# Patient Record
Sex: Female | Born: 1980 | Race: Black or African American | Hispanic: No | Marital: Single | Smoking: Never smoker
Health system: Southern US, Community
[De-identification: ages and names within clinical notes are randomized; demographics above are authoritative.]

---

## 2013-06-30 ENCOUNTER — Encounter (HOSPITAL_COMMUNITY): Payer: Self-pay | Admitting: Emergency Medicine

## 2013-06-30 ENCOUNTER — Emergency Department (HOSPITAL_COMMUNITY)
Admission: EM | Admit: 2013-06-30 | Discharge: 2013-06-30 | Disposition: A | Discharge status: Home / Self Care | Payer: Federal, State, Local not specified - PPO | Source: Home / Self Care | Attending: Emergency Medicine | Admitting: Emergency Medicine

## 2013-06-30 DIAGNOSIS — L509 Urticaria, unspecified: Secondary | ICD-10-CM

## 2013-06-30 LAB — POCT RAPID STREP A: STREPTOCOCCUS, GROUP A SCREEN (DIRECT): NEGATIVE

## 2013-06-30 MED ORDER — PREDNISONE 20 MG PO TABS
40.0000 mg | ORAL_TABLET | Freq: Once | ORAL | Status: AC
  Administered 2013-06-30: 40 mg via ORAL

## 2013-06-30 MED ORDER — FAMOTIDINE 40 MG PO TABS: 40.0000 mg | ORAL_TABLET | Freq: Once | ORAL | Status: DC

## 2013-06-30 MED ORDER — RANITIDINE HCL 75 MG PO TABS: 75.0000 mg | ORAL_TABLET | Freq: Two times a day (BID) | ORAL | Status: AC

## 2013-06-30 MED ORDER — PREDNISONE 20 MG PO TABS
ORAL_TABLET | ORAL | Status: AC
  Filled 2013-06-30: qty 2

## 2013-06-30 MED ORDER — DIPHENHYDRAMINE HCL 25 MG PO CAPS
ORAL_CAPSULE | ORAL | Status: AC
  Filled 2013-06-30: qty 2

## 2013-06-30 MED ORDER — DIPHENHYDRAMINE HCL 25 MG PO CAPS
50.0000 mg | ORAL_CAPSULE | Freq: Once | ORAL | Status: AC
  Administered 2013-06-30: 50 mg via ORAL

## 2013-06-30 MED ORDER — HYDROXYZINE PAMOATE 25 MG PO CAPS: 25.0000 mg | ORAL_CAPSULE | Freq: Three times a day (TID) | ORAL | Status: AC | PRN

## 2013-06-30 MED ORDER — PREDNISONE 10 MG PO TABS: ORAL_TABLET | ORAL | Status: AC

## 2013-06-30 NOTE — ED Notes (Signed)
Pt c/o a rash all over body including face, arms, chest, legs onset 1 week Reports rash got worse last night; sister has same sxs Denies f/v/n/d, new meds/foods/hygiene products, difficulty breathing/swallowing Alert w/no signs of acute distress.

## 2013-06-30 NOTE — Discharge Instructions (Signed)
Your test for strep throat was negative.  °

## 2013-06-30 NOTE — ED Provider Notes (Signed)
Medical screening examination/treatment/procedure(s) were performed by non-physician practitioner and as supervising physician I was immediately available for consultation/collaboration.  Leslee Homeavid Laberta Wilbon, M.D.  Reuben Likesavid C Somara Frymire, MD 06/30/13 [phone]

## 2013-06-30 NOTE — ED Provider Notes (Addendum)
CSN: [id number]     Arrival date & time 06/30/13  1110 History   First MD Initiated Contact with Patient 06/30/13 1134     Chief Complaint  Patient presents with  . Rash     (Consider location/radiation/quality/duration/timing/severity/associated sxs/prior Treatment) HPI Comments: Patient presents with rash that she woke with two days ago. Rash is diffusely distributed and consists of very pruritic maculopapular lesions over entire body. Denies any recent illness or new exposures. Only recent travel has been to [place], [place] with her family. No fever/chills. Of interest, her sister, who shares house with patient, woke with similar rash. Other family members that traveled with her to [place] do not have rash. Denies N/V/D, URI sx, or swelling of tongue, lips or throat. No previous similar events.  The history is provided by the patient.    History reviewed. No pertinent past medical history. History reviewed. No pertinent past surgical history. No family history on file. History  Substance Use Topics  . Smoking status: Never Smoker   . Smokeless tobacco: Not on file  . Alcohol Use: Yes   OB History   Grav Para Term Preterm Abortions TAB SAB Ect Mult Living                 Review of Systems  All other systems reviewed and are negative.      Allergies  Review of patient's allergies indicates no known allergies.  Home Medications   Current Outpatient Rx  Name  Route  Sig  Dispense  Refill  . Pramipexole Dihydrochloride (MIRAPEX PO)   Oral   Take by mouth.         . hydrOXYzine (VISTARIL) 25 MG capsule   Oral   Take 1 capsule (25 mg total) by mouth 3 (three) times daily as needed for itching.   30 capsule   0   . norethindrone-ethinyl estradiol-iron (MICROGESTIN FE,GILDESS FE,LOESTRIN FE) 1.5-30 MG-MCG tablet   Oral   Take 1 tablet by mouth daily.         . predniSONE (DELTASONE) 10 MG tablet      Beginning 07/01/2013, take 3 tabs po QD day 1, 2 tabs po QD  day 2, 1 tab po QD days 3 & 4 then stop   7 tablet   0   . ranitidine (ZANTAC) 75 MG tablet   Oral   Take 1 tablet (75 mg total) by mouth 2 (two) times daily. X 7 days   14 tablet   0    BP 143/77  Pulse 92  Temp(Src) 97.8 F (36.6 C) (Oral)  Resp 18  SpO2 100% Physical Exam  Nursing note and vitals reviewed. Constitutional: She is oriented to person, place, and time. She appears well-developed and well-nourished. No distress.  HENT:  Head: Normocephalic and atraumatic.  Right Ear: External ear normal.  Left Ear: External ear normal.  Nose: Nose normal.  Mouth/Throat: Oropharynx is clear and moist. No oropharyngeal exudate.  Eyes: Conjunctivae are normal. Right eye exhibits no discharge. Left eye exhibits no discharge. No scleral icterus.  Neck: Normal range of motion. Neck supple. No thyromegaly present.  Cardiovascular: Normal rate, regular rhythm and normal heart sounds.   Pulmonary/Chest: Effort normal and breath sounds normal. No stridor. No respiratory distress. She has no wheezes.  Abdominal: Soft. Bowel sounds are normal. She exhibits no distension. There is no tenderness.  Musculoskeletal: Normal range of motion. She exhibits no edema and no tenderness.  Lymphadenopathy:    She has no  cervical adenopathy.  Neurological: She is alert and oriented to person, place, and time.  Skin: Skin is warm and dry. Rash noted.  Widespread urticaria. No mucous membrane lesions.   Psychiatric: She has a normal mood and affect. Her behavior is normal.    ED Course  Procedures (including critical care time) Labs Review Labs Reviewed  CULTURE, GROUP A STREP  POCT RAPID STREP A ([facility] CARE ONLY)   Imaging Review No results found.    MDM   Final diagnoses:  Urticaria  Rapid strep negative. Exam consistent with urticaria of unknown origin. Despite sister having similar rash, condition appears to be an allergic response rather than infectious. Will begin treatment with H2  blocker, anti-histamine and oral steroids and advise follow up if no improvement.   Jess BartersJennifer Lee River Heights[place]on, [place] 06/30/13 [id number]  Geza Beranek Lee MurphyPresson, [place] 07/01/13 [phone]

## 2013-07-01 NOTE — ED Provider Notes (Signed)
Medical screening examination/treatment/procedure(s) were performed by non-physician practitioner and as supervising physician I was immediately available for consultation/collaboration.  Leslee Homeavid Chenoah Mcnally, M.D.  Reuben Likesavid C Ceirra Belli, MD 07/01/13 [phone]

## 2013-07-02 LAB — CULTURE, GROUP A STREP

## 2013-07-02 NOTE — ED Notes (Signed)
Patient called  w questions about her visit, medications prescribed , course of problem. States that if she is no better tomorrow, she will be rechecked

## 2016-01-26 ENCOUNTER — Other Ambulatory Visit: Payer: Self-pay | Admitting: Family Medicine

## 2016-01-26 ENCOUNTER — Ambulatory Visit
Admission: RE | Admit: 2016-01-26 | Discharge: 2016-01-26 | Disposition: A | Discharge status: Home / Self Care | Payer: Federal, State, Local not specified - PPO | Source: Ambulatory Visit | Attending: Family Medicine | Admitting: Family Medicine

## 2016-01-26 DIAGNOSIS — M5489 Other dorsalgia: Secondary | ICD-10-CM

## 2017-10-25 ENCOUNTER — Other Ambulatory Visit: Payer: Self-pay | Admitting: Obstetrics and Gynecology

## 2017-10-25 DIAGNOSIS — N6320 Unspecified lump in the left breast, unspecified quadrant: Secondary | ICD-10-CM

## 2018-03-17 ENCOUNTER — Ambulatory Visit: Payer: Federal, State, Local not specified - PPO

## 2018-03-17 ENCOUNTER — Ambulatory Visit: Payer: Federal, State, Local not specified - PPO | Admitting: Podiatry

## 2018-03-17 DIAGNOSIS — S92902S Unspecified fracture of left foot, sequela: Secondary | ICD-10-CM | POA: Diagnosis not present

## 2018-03-17 DIAGNOSIS — S92902A Unspecified fracture of left foot, initial encounter for closed fracture: Secondary | ICD-10-CM

## 2018-03-17 DIAGNOSIS — S86312A Strain of muscle(s) and tendon(s) of peroneal muscle group at lower leg level, left leg, initial encounter: Secondary | ICD-10-CM

## 2018-03-17 DIAGNOSIS — S92902K Unspecified fracture of left foot, subsequent encounter for fracture with nonunion: Secondary | ICD-10-CM | POA: Diagnosis not present

## 2018-03-18 ENCOUNTER — Telehealth: Payer: Self-pay | Admitting: *Deleted

## 2018-03-18 DIAGNOSIS — S92902S Unspecified fracture of left foot, sequela: Secondary | ICD-10-CM

## 2018-03-18 DIAGNOSIS — S92902K Unspecified fracture of left foot, subsequent encounter for fracture with nonunion: Secondary | ICD-10-CM

## 2018-03-18 DIAGNOSIS — S86312A Strain of muscle(s) and tendon(s) of peroneal muscle group at lower leg level, left leg, initial encounter: Secondary | ICD-10-CM

## 2018-03-18 NOTE — Telephone Encounter (Signed)
Orders to J. Quintana, RN for pre-cert, faxed to [facility]. 

## 2018-03-18 NOTE — Telephone Encounter (Signed)
-----   Message from Vivi Barrack, DPM sent at 03/18/2018  9:34 AM EST ----- Can you please order an MRI of the left ankle to rule out a peroneal tendon tear and have them include as much of the foot as possible. She has a 5th metatarsal base fracture that I would like evaluated as well. Can you see if they can get all of that on one study so we don't have to order 2 tests? Thanks.

## 2018-03-18 NOTE — Progress Notes (Signed)
Subjective:   Patient ID: Tara Trujillo, female   DOB: 37 y.o.   MRN: [id number]   HPI 37 year old female presents the office today for concerns of continued pain to her left lateral foot.  She states that she fell and she broke her fifth metatarsal base on December 07, 2017 at that point she was seen at urgent care and had x-rays performed.  She followed up with orthopedics as well and she was also in a surgical shoe.  She went for follow-up couple of appointments however she did recently go to urgent care again on 04/11/2018 to work continued pain and she had new x-rays performed which does still reveal the fracture.  She is continue to get pain in the aspect foot as well as going up her ankle.  Document regular shoes but is affecting her activity level.  She also has undergone physical therapy without any significant improvement.  She presents today for second opinion.   Review of Systems  All other systems reviewed and are negative.  No past medical history on file.  No past surgical history on file.   Current Outpatient Medications:  .  hydrOXYzine (VISTARIL) 25 MG capsule, Take 1 capsule (25 mg total) by mouth 3 (three) times daily as needed for itching., Disp: 30 capsule, Rfl: 0 .  norethindrone-ethinyl estradiol-iron (MICROGESTIN FE,GILDESS FE,LOESTRIN FE) 1.5-30 MG-MCG tablet, Take 1 tablet by mouth daily., Disp: , Rfl:  .  Pramipexole Dihydrochloride (MIRAPEX PO), Take by mouth., Disp: , Rfl:  .  predniSONE (DELTASONE) 10 MG tablet, Beginning 07/01/2013, take 3 tabs po QD day 1, 2 tabs po QD day 2, 1 tab po QD days 3 & 4 then stop, Disp: 7 tablet, Rfl: 0 .  ranitidine (ZANTAC) 75 MG tablet, Take 1 tablet (75 mg total) by mouth 2 (two) times daily. X 7 days, Disp: 14 tablet, Rfl: 0  No Known Allergies        Objective:  Physical Exam   General: AAO x3, NAD  Dermatological: Skin is warm, dry and supple bilateral. Nails x 10 are well manicured; remaining integument appears  unremarkable at this time. There are no open sores, no preulcerative lesions, no rash or signs of infection present.  Vascular: Dorsalis Pedis artery and Posterior Tibial artery pedal pulses are 2/4 bilateral with immedate capillary fill time. Pedal hair growth present. No varicosities and no lower extremity edema present bilateral. There is no pain with calf compression, swelling, warmth, erythema.   Neruologic: Grossly intact via light touch bilateral.Protective threshold with Semmes Wienstein monofilament intact to all pedal sites bilateral.  Negative Tinel sign.  Musculoskeletal: There is tenderness palpation of the left foot on the tarsal base.  There is also tenderness on the course the peroneal tendon just posterior and inferior to the lateral malleolus on the insertion of the fifth metatarsal base.  Overall the tendon appears to be intact.  Achilles tendon intact.  Mild discomfort of the fourth metatarsal base as well.  No other areas of pinpoint tenderness.  Mild swelling to the lateral aspect the foot on the fifth metatarsal base but there is no erythema or warmth.  No other areas of tenderness.  Muscular strength 5/5 in all groups tested bilateral.  Gait: Unassisted, Nonantalgic.       Assessment:   Fifth metatarsal base nonunion     Plan:  -Treatment options discussed including all alternatives, risks, and complications -Etiology of symptoms were discussed -I independently reviewed the x-rays that she brought in.  Note the initial x-rays which revealed an avulsion fracture of the fifth metatarsal base.  Performed on December 07, 2017.  On repeat x-rays performed on March 15, 2018 with a radiolucent line still evident at fifth metatarsal base consistent with a nonunion with approximate fracture gap of 2 mm.  Due to this and will order a bone stimulator given the nonunion.  Also I dispensed a cam boot and I want her to wear.  I am also can order an MRI to evaluate the nonunion as well as  the integrity of the peroneal tendon throughout peroneal tendon tear given continued pain despite treatment over the last 3 months.  Vivi Barrack DPM

## 2018-03-25 ENCOUNTER — Telehealth: Payer: Self-pay | Admitting: Podiatry

## 2018-03-25 NOTE — Telephone Encounter (Signed)
I was suppose to be contacted by someone for a bone stimulator and I have not heard anything.

## 2018-03-25 NOTE — Telephone Encounter (Signed)
Left message for T. Roseanne RenoStewart - [employer] to call with status of bone growth stimulator.

## 2018-03-25 NOTE — Telephone Encounter (Signed)
I had spoken to [place] about this. Can you check with him to see the status? Thanks.

## 2018-03-26 NOTE — Telephone Encounter (Signed)
I spoke with T. Roseanne RenoStewart - [employer], and he states he is to contact pt today with coverage information.

## 2018-03-27 ENCOUNTER — Ambulatory Visit (HOSPITAL_COMMUNITY)
Admission: RE | Admit: 2018-03-27 | Discharge: 2018-03-27 | Disposition: A | Discharge status: Home / Self Care | Payer: Federal, State, Local not specified - PPO | Source: Ambulatory Visit | Attending: Podiatry | Admitting: Podiatry

## 2018-03-27 DIAGNOSIS — S92902S Unspecified fracture of left foot, sequela: Secondary | ICD-10-CM | POA: Diagnosis present

## 2018-03-27 DIAGNOSIS — S86312A Strain of muscle(s) and tendon(s) of peroneal muscle group at lower leg level, left leg, initial encounter: Secondary | ICD-10-CM | POA: Diagnosis not present

## 2018-03-27 DIAGNOSIS — S92352A Displaced fracture of fifth metatarsal bone, left foot, initial encounter for closed fracture: Secondary | ICD-10-CM | POA: Insufficient documentation

## 2018-04-01 ENCOUNTER — Telehealth: Payer: Self-pay | Admitting: *Deleted

## 2018-04-01 NOTE — Telephone Encounter (Signed)
Pt called for MRI results.

## 2018-04-01 NOTE — Telephone Encounter (Signed)
I informed pt of Dr. Gabriel RungWagoner's review of the MRi results. Pt states she has not received a call from the [employer] bone growth stimulator representative. I gave pt Thurston Poundsrey Stewart[employer] - [employer] mobile number.

## 2018-04-01 NOTE — Telephone Encounter (Signed)
Please let her know that the MRI did show a chronic fracture of the 5th metatarsal base. Did she get her bone stimulator?

## 2018-04-16 ENCOUNTER — Ambulatory Visit (INDEPENDENT_AMBULATORY_CARE_PROVIDER_SITE_OTHER): Payer: Federal, State, Local not specified - PPO

## 2018-04-16 ENCOUNTER — Ambulatory Visit (INDEPENDENT_AMBULATORY_CARE_PROVIDER_SITE_OTHER): Payer: Federal, State, Local not specified - PPO | Admitting: Podiatry

## 2018-04-16 DIAGNOSIS — S92902S Unspecified fracture of left foot, sequela: Secondary | ICD-10-CM

## 2018-04-21 ENCOUNTER — Ambulatory Visit: Payer: Federal, State, Local not specified - PPO | Admitting: Podiatry

## 2018-04-21 NOTE — Progress Notes (Signed)
Subjective: 37 year old female presents the office for evaluation of a subacute fracture to the fifth metatarsal base of the left foot.  Since I last saw her she did order a bone stimulator online and she is been wearing the cam boot overall her foot feels much better.  She denies any increase in swelling or redness or any recent injury or trauma since I last saw her. Denies any systemic complaints such as fevers, chills, nausea, vomiting. No acute changes since last appointment, and no other complaints at this time.   Objective: AAO x3, NAD DP/PT pulses palpable bilaterally, CRT less than 3 seconds Overall no significant improvement in the tenderness in the left foot.  Only very minimal tenderness palpation of the fifth metatarsal base is present.  The pain over the course of the peroneal tendon over the tendon appears to be intact.  There is no erythema or warmth.  No open lesions or pre-ulcerative lesions.  No pain with calf compression, swelling, warmth, erythema  Assessment: Healing fracture left fifth metatarsal base  Plan: -All treatment options discussed with the patient including all alternatives, risks, complications.  -X-rays obtained reviewed.  Increased consolidation across the fracture site.  No evidence of acute fracture. -At this point I want her to remain in the cam boot.  Continue the bone stimulator daily.  Continue ice elevation and limit activity. -Patient encouraged to call the office with any questions, concerns, change in symptoms.   Return in about 4 weeks (around 05/14/2018). x-ray next appointment   Vivi BarrackMatthew R Lolly Glaus DPM

## 2018-05-15 ENCOUNTER — Ambulatory Visit (INDEPENDENT_AMBULATORY_CARE_PROVIDER_SITE_OTHER): Payer: Federal, State, Local not specified - PPO

## 2018-05-15 ENCOUNTER — Ambulatory Visit: Payer: Federal, State, Local not specified - PPO | Admitting: Podiatry

## 2018-05-15 ENCOUNTER — Encounter: Payer: Self-pay | Admitting: Podiatry

## 2018-05-15 DIAGNOSIS — M779 Enthesopathy, unspecified: Secondary | ICD-10-CM

## 2018-05-15 DIAGNOSIS — S92902S Unspecified fracture of left foot, sequela: Secondary | ICD-10-CM | POA: Diagnosis not present

## 2018-05-15 NOTE — Patient Instructions (Signed)
Ankle Sprain, Phase I Rehab Ask your health care provider which exercises are safe for you. Do exercises exactly as told by your health care provider and adjust them as directed. It is normal to feel mild stretching, pulling, tightness, or discomfort as you do these exercises, but you should stop right away if you feel sudden pain or your pain gets worse.Do not begin these exercises until told by your health care provider. Stretching and range of motion exercises These exercises warm up your muscles and joints and improve the movement and flexibility of your lower leg and ankle. These exercises also help to relieve pain and stiffness. Exercise A: Gastroc and soleus stretch  1. Sit on the floor with your left / right leg extended. 2. Loop a belt or towel around the ball of your left / right foot. The ball of your foot is on the walking surface, right under your toes. 3. Keep your left / right ankle and foot relaxed and keep your knee straight while you use the belt or towel to pull your foot toward you. You should feel a gentle stretch behind your calf or knee. 4. Hold this position for __________ seconds, then release to the starting position. Repeat the exercise with your knee bent. You can put a pillow or a rolled bath towel under your knee to support it. You should feel a stretch deep in your calf or at your Achilles tendon. Repeat each stretch __________ times. Complete these stretches __________ times a day. Exercise B: Ankle alphabet  1. Sit with your left / right leg supported at the lower leg. ? Do not rest your foot on anything. ? Make sure your foot has room to move freely. 2. Think of your left / right foot as a paintbrush, and move your foot to trace each letter of the alphabet in the air. Keep your hip and knee still while you trace. Make the letters as large as you can without feeling discomfort. 3. Trace every letter from A to Z. Repeat __________ times. Complete this exercise  __________ times a day. Strengthening exercises These exercises build strength and endurance in your ankle and lower leg. Endurance is the ability to use your muscles for a long time, even after they get tired. Exercise C: Dorsiflexors  1. Secure a rubber exercise band or tube to an object, such as a table leg, that will stay still when the band is pulled. Secure the other end around your left / right foot. 2. Sit on the floor facing the object, with your left / right leg extended. The band or tube should be slightly tense when your foot is relaxed. 3. Slowly bring your foot toward you, pulling the band tighter. 4. Hold this position for __________ seconds. 5. Slowly return your foot to the starting position. Repeat __________ times. Complete this exercise __________ times a day. Exercise D: Plantar flexors  1. Sit on the floor with your left / right leg extended. 2. Loop a rubber exercise tube or band around the ball of your left / right foot. The ball of your foot is on the walking surface, right under your toes. ? Hold the ends of the band or tube in your hands. ? The band or tube should be slightly tense when your foot is relaxed. 3. Slowly point your foot and toes downward, pushing them away from you. 4. Hold this position for __________ seconds. 5. Slowly return your foot to the starting position. Repeat __________ times. Complete   this exercise __________ times a day. Exercise E: Evertors 1. Sit on the floor with your legs straight out in front of you. 2. Loop a rubber exercise band or tube around the ball of your left / right foot. The ball of your foot is on the walking surface, right under your toes. ? Hold the ends of the band in your hands, or secure the band to a stable object. ? The band or tube should be slightly tense when your foot is relaxed. 3. Slowly push your foot outward, away from your other leg. 4. Hold this position for __________ seconds. 5. Slowly return your  foot to the starting position. Repeat __________ times. Complete this exercise __________ times a day. This information is not intended to replace advice given to you by your health care provider. Make sure you discuss any questions you have with your health care provider. Document Released: 11/28/2004 Document Revised: 10/14/2016 Document Reviewed: 03/13/2015 Elsevier Interactive Patient Education  2019 [employer]    Peroneal Tendinopathy Rehab Ask your health care provider which exercises are safe for you. Do exercises exactly as told by your health care provider and adjust them as directed. It is normal to feel mild stretching, pulling, tightness, or discomfort as you do these exercises, but you should stop right away if you feel sudden pain or your pain gets worse.Do not begin these exercises until told by your health care provider. Stretching and range of motion exercises These exercises warm up your muscles and joints and improve the movement and flexibility of your ankle. These exercises also help to relieve pain and stiffness. Exercise A: Gastroc and soleus, standing 1. Stand on the edge of a step on the balls of your feet. The ball of your foot is on the walking surface, right under your toes. 2. Hold onto the railing for balance. 3. Slowly lift your left / right foot, allowing your body weight to press your left / right heel down over the edge of the step. You should feel a stretch in your left / right calf. 4. Hold this position for __________ seconds. Repeat __________ times with your left / right knee straight and __________ times with your left / right knee bent. Complete this stretch __________ times per day. Strengthening exercises These exercises improve the strength and endurance of your foot and ankle. Endurance is the ability to use your muscles for a long time, even after they get tired. Exercise B: Dorsiflexors  1. Secure a rubber exercise band or tube to an object,  like a table leg, that will not move if it is pulled on. 2. Secure the other end of the band around your left / right foot. 3. Sit on the floor, facing the object with your left / right foot extended. The band or tube should be slightly tense when your foot is relaxed. 4. Slowly flex your left / right ankle and toes to bring your foot toward you. 5. Hold this position for __________ seconds. 6. Slowly return your foot to the starting position. Repeat __________ times. Complete this exercise __________ times per day. Exercise C: Evertors 1. Sit on the floor with your legs straight out in front of you. 2. Loop a rubber exercise or band or tube around the ball of your left / right foot. The ball of your foot is on the walking surface, right under your toes. 3. Hold the ends of the band in your hands, or secure the band to a stable object.  4. Slowly push your foot outward, away from your other leg. 5. Hold this position for __________ seconds. 6. Slowly return your foot to the starting position. Repeat __________ times. Complete this exercise __________ times per day. Exercise D: Standing heel raise (plantar flexion) 1. Stand with your feet shoulder-width apart with the balls of your feet on a step. The ball of your foot is on the walking surface, right under your toes. 2. Keep your weight spread evenly over the width of your feet while you rise up on your toes. Use a wall or railing to steady yourself, but try not to use it for support. 3. If this exercise is too easy, try these options: ? Shift your weight toward your left / right leg until you feel challenged. ? If told by your health care provider, stand on your left / right leg only. 4. Hold this position for __________ seconds. Repeat __________ times. Complete this exercise __________ times per day. Exercise E: Single leg stand 1. Without shoes, stand near a railing or in a doorway. You may hold onto the railing or door frame as  needed. 2. Stand on your left / right foot. Keep your big toe down on the floor and try to keep your arch lifted. ? Do not roll to the outside of your foot. ? If this exercise is too easy, you can try it with your eyes closed or while standing on a pillow. 3. Hold this position for __________ seconds. Repeat __________ times. Complete this exercise __________ times per day. This information is not intended to replace advice given to you by your health care provider. Make sure you discuss any questions you have with your health care provider. Document Released: 04/29/2005 Document Revised: 01/04/2016 Document Reviewed: 03/18/2015 Elsevier Interactive Patient Education  2019 [employer].

## 2018-05-20 NOTE — Progress Notes (Signed)
Subjective: 38 year old female presents the office today for follow-up evaluation of left fifth metatarsal base fracture.  She states that she was wearing the boot and the foot is been feeling great and when she took the boot off and she started become more active she started get swelling to the ankle and she has discomfort of the ankle.  She is more concerned about the ankle discomfort at this time than the foot but the foot is been doing well.  Denies any recent injuries or falls or any changes since I last saw her.  Denies any systemic complaints such as fevers, chills, nausea, vomiting. No acute changes since last appointment, and no other complaints at this time.   Objective: AAO x3, NAD DP/PT pulses palpable bilaterally, CRT less than 3 seconds There is no tenderness palpation of the fifth metatarsal base at this time.  There is some discomfort along the lateral aspect ankle posterior to the lateral malleolus on the peroneal tendon but overall the tendon appears to be intact.  Minimal discomfort to the anterior lateral ankle joint along the ATFL.  Anterior drawer, talar tilt appears to be negative.  Minimal discomfort in sinus tarsi.  No area pinpoint tenderness elicited.  No pain with ankle or subtalar range of motion. No open lesions or pre-ulcerative lesions.  No pain with calf compression, swelling, warmth, erythema  Assessment: Tendinitis left ankle, capsulitis; resolving fifth metatarsal base avulsion fracture  Plan: -All treatment options discussed with the patient including all alternatives, risks, complications.  -X-rays were obtained and reviewed of the foot.  No new evidence of acute fracture and there is evidence of increased consolidation across the fracture to the fifth metatarsal base. -She is been immobilized and not active for several months because of the fracture but this is resulted in overuse tendinitis to the ankle.  On Bonita Quin order physical therapy she is in agreement to  this and a prescription for [facility] was written today.  Continue meloxicam as needed.  Continue ice elevate as well as compression. -Patient encouraged to call the office with any questions, concerns, change in symptoms.   Vivi Barrack DPM

## 2018-06-12 ENCOUNTER — Ambulatory Visit (INDEPENDENT_AMBULATORY_CARE_PROVIDER_SITE_OTHER): Payer: Federal, State, Local not specified - PPO

## 2018-06-12 ENCOUNTER — Other Ambulatory Visit: Payer: Self-pay | Admitting: Podiatry

## 2018-06-12 ENCOUNTER — Encounter: Payer: Self-pay | Admitting: Podiatry

## 2018-06-12 ENCOUNTER — Ambulatory Visit: Payer: Federal, State, Local not specified - PPO | Admitting: Podiatry

## 2018-06-12 DIAGNOSIS — S92902D Unspecified fracture of left foot, subsequent encounter for fracture with routine healing: Secondary | ICD-10-CM

## 2018-06-12 DIAGNOSIS — M779 Enthesopathy, unspecified: Secondary | ICD-10-CM

## 2018-06-12 DIAGNOSIS — S92902S Unspecified fracture of left foot, sequela: Secondary | ICD-10-CM | POA: Diagnosis not present

## 2018-06-22 NOTE — Progress Notes (Signed)
Subjective: 38 year old female presents the office today for follow-up evaluation of left fifth metatarsal base fracture as well as right ankle pain.  Overall she states that she is doing much better she having no discomfort.  He has been on physical therapy and she said this is been very helpful but she feels that she can do the same exercises at home.  She denies recent injury or falls and she has no other concerns today.  Objective: AAO x3, NAD-presents wearing regular shoes without any issues. DP/PT pulses palpable bilaterally, CRT less than 3 seconds There is no tenderness palpation of the fifth metatarsal base at this time.  There is also no discomfort to palpation on the lateral aspect the ankle.  Specifically there is no pain in the sinus tarsi.  No pain to the lateral ankle ligaments.  I am not able to elicit any area of tenderness today.  There is no edema, erythema, increased warmth. No open lesions or pre-ulcerative lesions.  No pain with calf compression, swelling, warmth, erythema  Assessment: Tendinitis left ankle, capsulitis; resolving fifth metatarsal base avulsion fracture  Plan: -All treatment options discussed with the patient including all alternatives, risks, complications.  -X-rays obtained reviewed.  No evidence of acute fracture there is evidence of healing along the fifth metatarsal base. -Overall she is doing much better.  She is been to 1 more session of physical therapy in order for her to transition to home exercise program and I discussed this with Hale Bogus from PT today.  Also continue with supportive shoes.  She is having no pain today and overall she is doing much better she is doing her regular activity can discharge her at this point but if she has any issues she is to let me know she is very thankful and has no further questions today.  Vivi Barrack DPM

## 2018-10-20 ENCOUNTER — Telehealth: Payer: Self-pay | Admitting: Podiatry

## 2018-10-20 MED ORDER — MELOXICAM 7.5 MG PO TABS: 7.5000 mg | ORAL_TABLET | Freq: Every day | ORAL | 0 refills | Status: AC

## 2018-10-20 NOTE — Telephone Encounter (Signed)
OK to do Mobic 7.5mg  daily disp #30 no refills.  If she continues to get any increase in swelling or any pain she should come in. Thanks.

## 2018-10-20 NOTE — Telephone Encounter (Signed)
I called pt and told her I did not see that Dr. Jacqualyn Posey had ordered the meloxicam, but if she was continuing to have a pain and swelling she would benefit from an appt since she was last seen 05/2018. Pt states she is not having pain she is having swelling and she was told she could actually have swelling for 1 year after an injury. I told pt I would send a message to Dr. Jacqualyn Posey.

## 2018-10-20 NOTE — Telephone Encounter (Signed)
Left message informing pt of Dr. Wagoner's orders. 

## 2018-10-20 NOTE — Telephone Encounter (Signed)
Pt is requesting Meloxicam due to the swelling in her left foot. Please call patient

## 2018-10-20 NOTE — Addendum Note (Signed)
Addended by: Harriett Sine D on: 10/20/2018 03:27 PM   Modules accepted: Orders

## 2019-03-02 ENCOUNTER — Ambulatory Visit: Payer: Federal, State, Local not specified - PPO | Admitting: Podiatry

## 2019-03-02 ENCOUNTER — Other Ambulatory Visit: Payer: Self-pay

## 2019-03-02 ENCOUNTER — Ambulatory Visit (INDEPENDENT_AMBULATORY_CARE_PROVIDER_SITE_OTHER): Payer: Federal, State, Local not specified - PPO

## 2019-03-02 ENCOUNTER — Telehealth: Payer: Self-pay | Admitting: *Deleted

## 2019-03-02 DIAGNOSIS — S92902S Unspecified fracture of left foot, sequela: Secondary | ICD-10-CM | POA: Diagnosis not present

## 2019-03-02 DIAGNOSIS — S86312A Strain of muscle(s) and tendon(s) of peroneal muscle group at lower leg level, left leg, initial encounter: Secondary | ICD-10-CM

## 2019-03-02 DIAGNOSIS — M779 Enthesopathy, unspecified: Secondary | ICD-10-CM

## 2019-03-02 NOTE — Telephone Encounter (Signed)
-----   Message from Trula Slade, DPM sent at 03/02/2019 12:39 PM EDT ----- Can you please order a diagnostic ultrasound to evaluate the peroneal tendon left ankle

## 2019-03-02 NOTE — Progress Notes (Signed)
Subjective: 38 year old female presents the office today for concerns of continuing to have some mild discomfort to the left ankle.  She points on the lateral aspect ankle in course the peroneal tendon but she states that she gets a dull ache.  She said that it does not stop her from doing activities but she still notices swelling to the area.  She states that she can do her exercises with daily activities but any problems afterwards when she gets majority of discomfort.  She has tried changing shoes but a significant improvement as well.Denies any systemic complaints such as fevers, chills, nausea, vomiting. No acute changes since last appointment, and no other complaints at this time.   Objective: AAO x3, NAD DP/PT pulses palpable bilaterally, CRT less than 3 seconds There is no pain of the fifth metatarsal base.  There is mild discomfort on the course of the peroneal tendon just posterior to the lateral malleolus and there is mild edema to this area.  There is no significant tenderness palpation on exam there is no erythema or warmth.  Flexor, extensor tendons appear to be intact.  MMT 5/5. No pain with calf compression, swelling, warmth, erythema  Assessment: Concern for partial tear peroneal tendon, tendonitis   Plan: -All treatment options discussed with the patient including all alternatives, risks, complications.  -X-rays obtained reviewed.  Fracture appears to be healed.  No evidence of acute fracture otherwise.  Given continuation of symptoms in the order diagnostic ultrasound to evaluate the peroneal tendon.  She previously had MRI last year that showed the tendon was intact. -Patient encouraged to call the office with any questions, concerns, change in symptoms.   No follow-ups on file.  Trula Slade DPM

## 2019-03-02 NOTE — Telephone Encounter (Signed)
Faxed orders to [facility]. 

## 2019-03-05 ENCOUNTER — Telehealth: Payer: Self-pay | Admitting: *Deleted

## 2019-03-05 NOTE — Telephone Encounter (Signed)
Left message informing pt the [place] had been ordered and faxed to [facility] and she could call [phone] to schedule.

## 2019-03-05 NOTE — Telephone Encounter (Signed)
Pt states she Dr. Jacqualyn Posey ordered US and no one has called her.

## 2019-03-08 ENCOUNTER — Other Ambulatory Visit: Payer: Federal, State, Local not specified - PPO

## 2019-03-19 ENCOUNTER — Ambulatory Visit
Admission: RE | Admit: 2019-03-19 | Discharge: 2019-03-19 | Disposition: A | Discharge status: Home / Self Care | Payer: Federal, State, Local not specified - PPO | Source: Ambulatory Visit | Attending: Podiatry | Admitting: Podiatry

## 2019-03-19 DIAGNOSIS — S86312A Strain of muscle(s) and tendon(s) of peroneal muscle group at lower leg level, left leg, initial encounter: Secondary | ICD-10-CM

## 2019-03-25 ENCOUNTER — Telehealth: Payer: Self-pay | Admitting: *Deleted

## 2019-03-25 NOTE — Telephone Encounter (Signed)
I informed pt of Dr. Leigh Aurora review of results and orders and also offered the EPAT and told pt I would have to have a scheduler call concerning the EPAT schedule.

## 2019-03-25 NOTE — Telephone Encounter (Signed)
-----   Message from Trula Slade, DPM sent at 03/25/2019  7:11 AM EST ----- Val- please let her know that the peroneal tendons were unremarkable on ultrasound. I would work on continuing home PT, inserts and supportive shoes. We can also try EPAT on the peroneal tendon if she would like.

## 2019-04-21 ENCOUNTER — Telehealth: Payer: Self-pay | Admitting: Podiatry

## 2019-04-21 NOTE — Telephone Encounter (Signed)
I called her and left her a message. I was under the assumption that Tara Trujillo had talked to her to go over the plan. I would have started EPAT on her. I apologized for the miscommunication and we would have the records for her. Also offered to put in a referral for her as well. I would be happy to continue care with her if she desired. If she wises we can do EPAT for her at no charge for her inconvenience.

## 2019-04-21 NOTE — Telephone Encounter (Signed)
I'm going to see a new doctor about issues I'm having with my foot since I haven't heard back from you guys on anything. I wanted to see what the process is on picking up my medical records including all of my x-rays and ultrasound I recently had done. Do I need to contact the imaging center to get my ultrasound or if it would be included in my records I pick up there. Please let me know when I would be able to pick them up as well. Thanks so much.

## 2019-04-21 NOTE — Telephone Encounter (Signed)
Called pt to let her know I have printed her requested medical records and burned her x-rays to a disc and was laying them up front for her to pick up at her convenience. Told her when she picked them up she would need to fill out and sign a medical records release form. I also told her I had printed the reports from her MRI and Ultrasound but if she needed the images, she would need to contact [facility] for those.

## 2019-08-17 IMAGING — MR MR ANKLE*L* W/O CM
4 of 6 series · 19 of 40 positions shown · non-contrast
Comparison: None.

CLINICAL DATA: Lateral foot pain. Fifth metatarsal base fracture
since [REDACTED].

EXAM:
MRI OF THE LEFT ANKLE WITHOUT CONTRAST
TECHNIQUE: Multiplanar, multisequence MR imaging of the ankle was performed. No
intravenous contrast was administered.

[Series 3: T1 · sagittal · 3.0mm · 0.29mm/px · 3 of 22 slices shown]
[im 1/22]
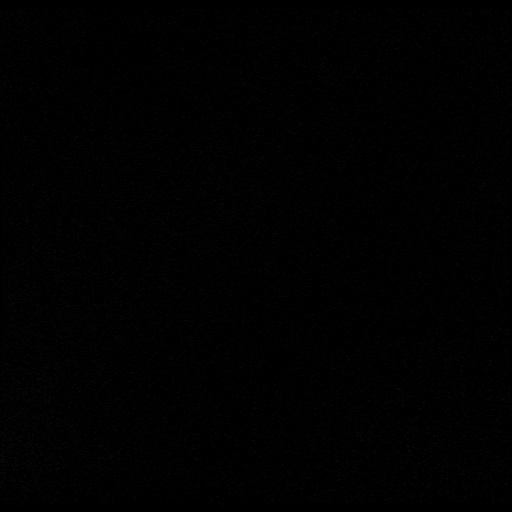
[im 11/22]
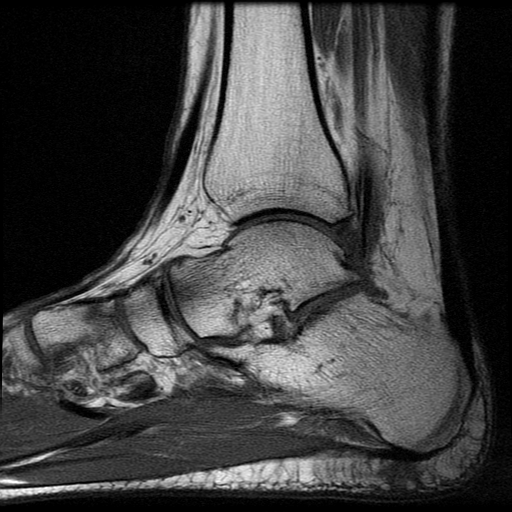
[im 22/22]
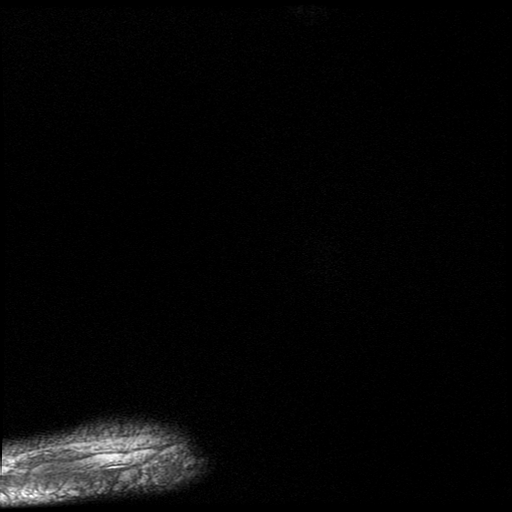

[Series 4: T2 fat-sat · axial · 3.0mm · 0.29mm/px · z∈[-85,+31]mm · 5 of 35 slices shown (1 of 2)]
[im 1/35]
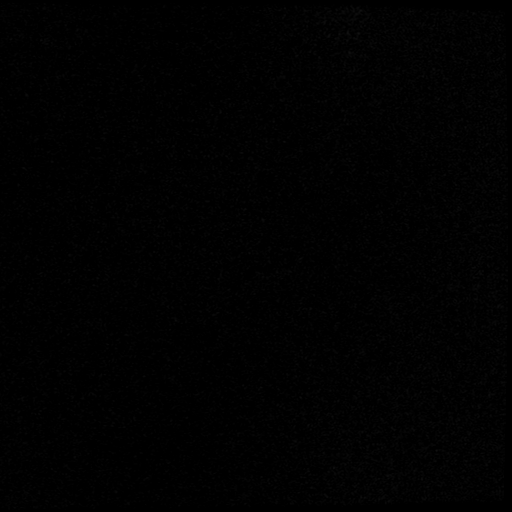
[im 5/35]
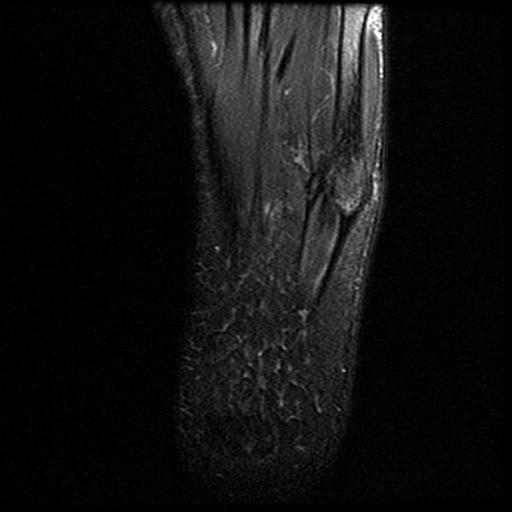
[im 10/35]
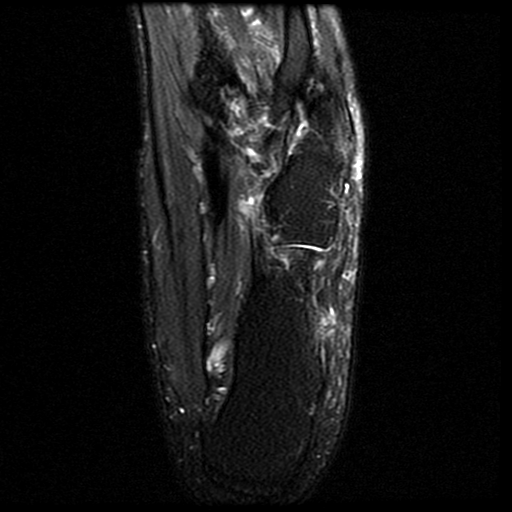
[im 20/35]
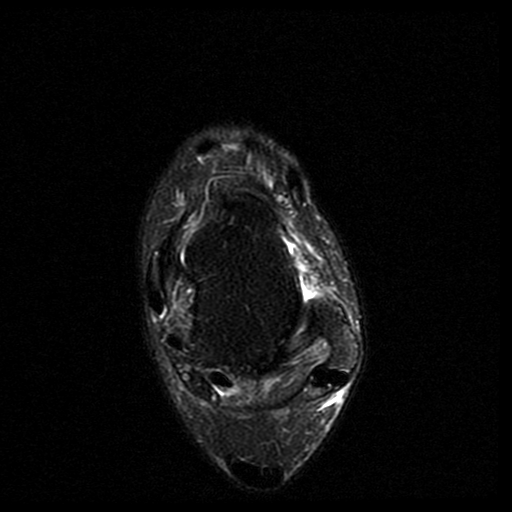
[im 30/35]
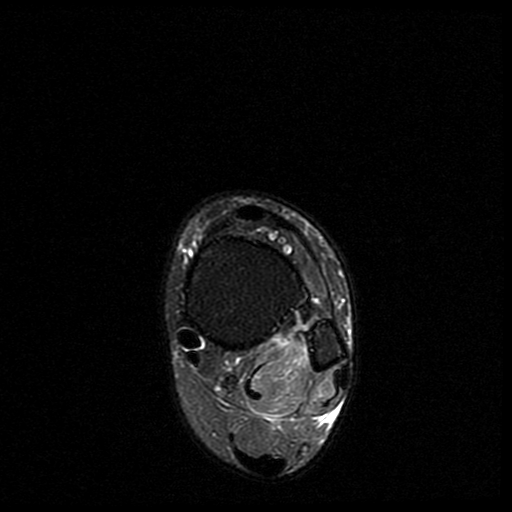

[Series 5: PD fat-sat · axial · 3.0mm · 0.29mm/px · z∈[-85,+51]mm · 8 of 35 slices shown]
[im 1/35]
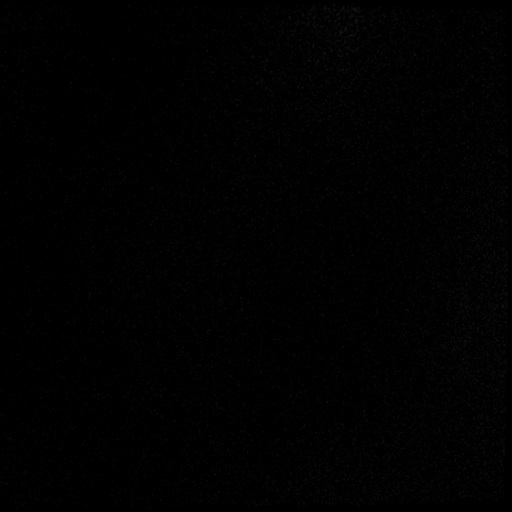
[im 5/35]
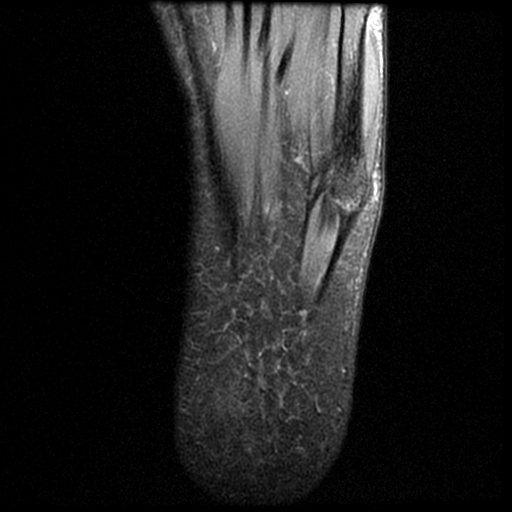
[im 10/35]
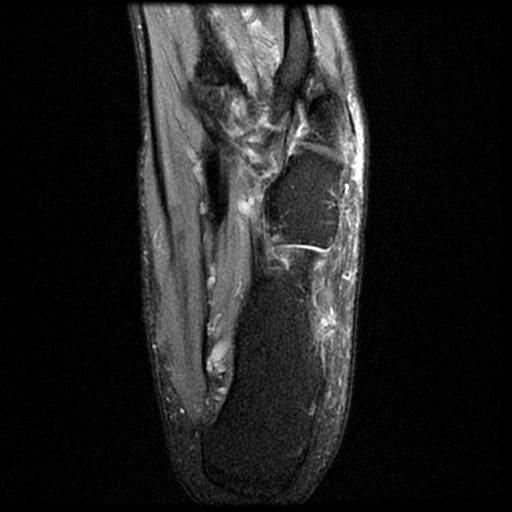
[im 15/35]
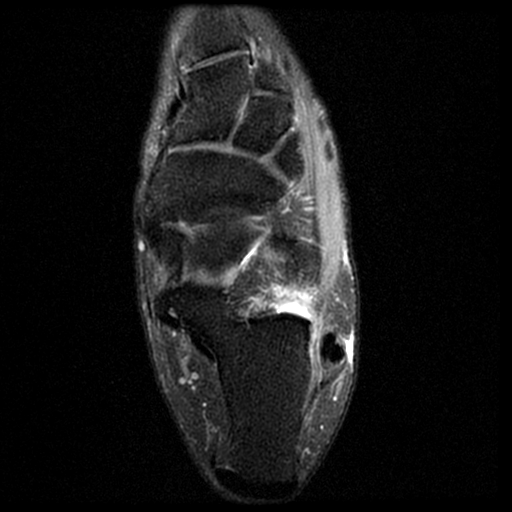
[im 20/35]
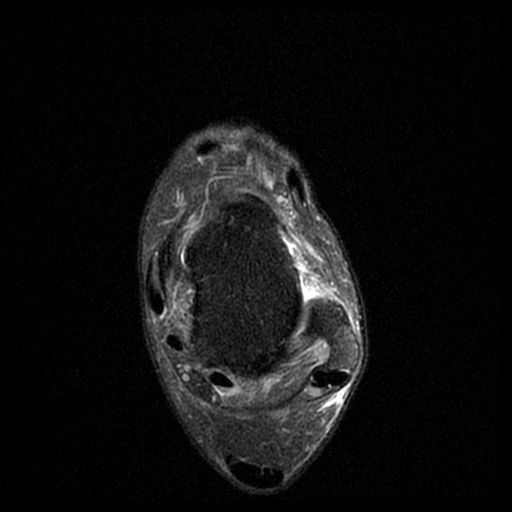
[im 25/35]
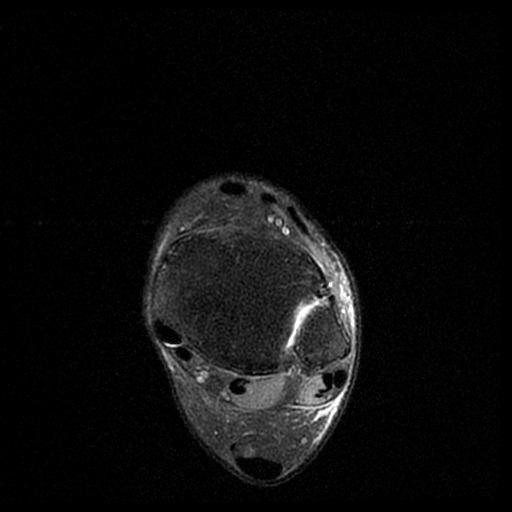
[im 30/35]
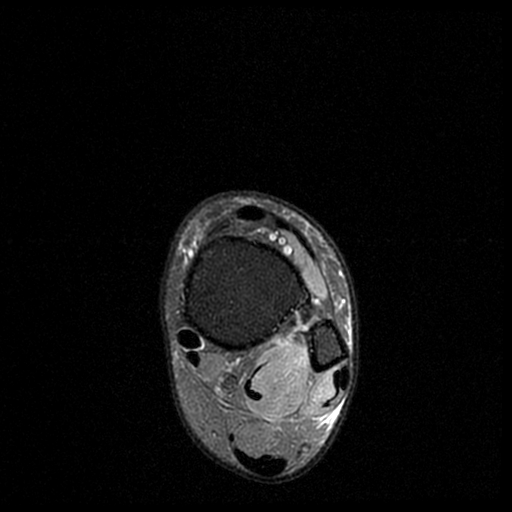
[im 35/35]
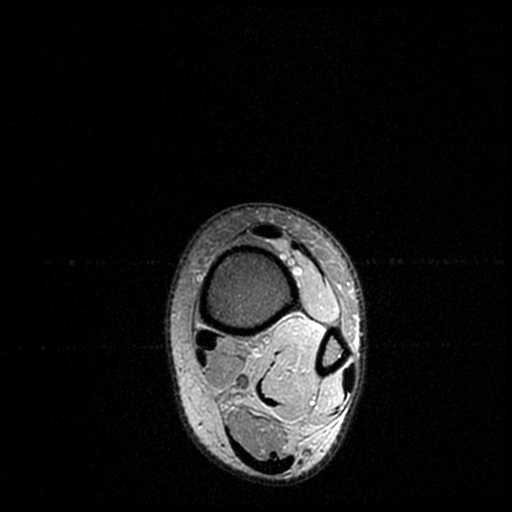

[Series 6: T2 fat-sat · coronal · 3.0mm · 0.29mm/px · 3 of 32 slices shown (2 of 2)]
[im 6/32]
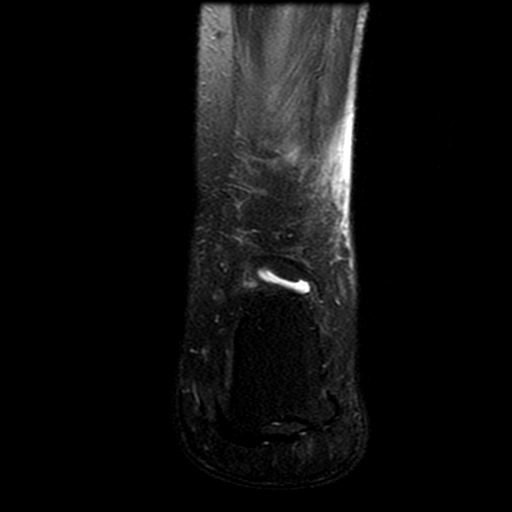
[im 16/32]
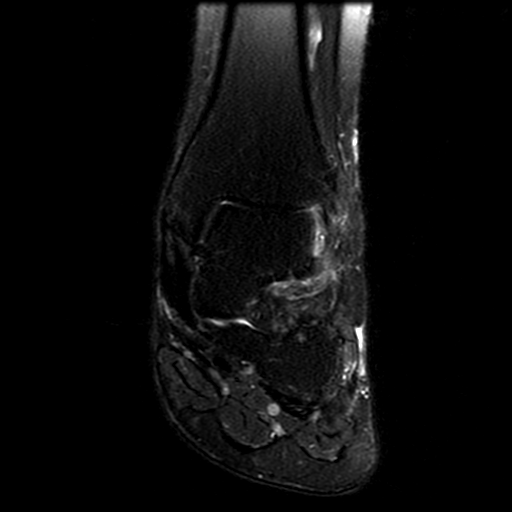
[im 26/32]
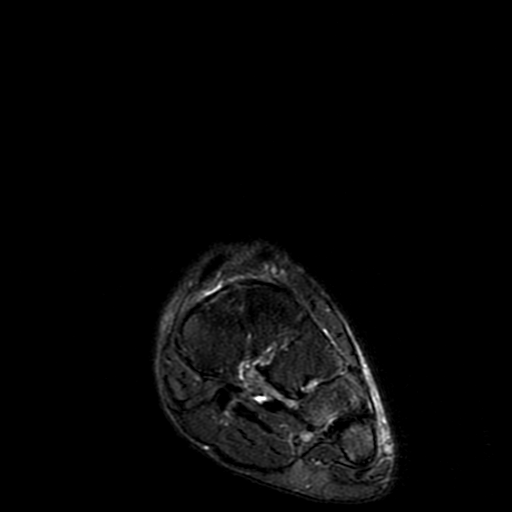

[19 of 40 positions shown; findings below may reference images not displayed]

FINDINGS: TENDONS

Peroneal: Peroneal longus tendon intact. Peroneal brevis intact.

Posteromedial: Posterior tibial tendon intact. Flexor hallucis
longus tendon intact. Flexor digitorum longus tendon intact.

Anterior: Tibialis anterior tendon intact. Extensor hallucis longus
tendon intact Extensor digitorum longus tendon intact.

Achilles:  Intact.

Plantar Fascia: Intact.

LIGAMENTS

Lateral: Anterior talofibular ligament intact. Calcaneofibular
ligament intact. Posterior talofibular ligament intact. Anterior and
posterior tibiofibular ligaments intact.

Medial: Deltoid ligament intact. Spring ligament intact. Lisfranc
ligament intact.

CARTILAGE

Ankle Joint: No joint effusion. Normal ankle mortise. No chondral
defect.

Subtalar Joints/Sinus Tarsi: Normal subtalar joints. No subtalar
joint effusion. Normal sinus tarsi.

Bones: There is a nonunited avulsion fracture at the base of the
fifth metatarsal with minimal surrounding marrow edema. No
dislocation. No focal bone lesion.

Soft Tissue: Mild edema in the distal flexor hallucis longus muscle.
No soft tissue mass or fluid collection.
IMPRESSION: 1. Subacute to chronic appearing nonunited avulsion fracture of the
fifth metatarsal base.
2. Intact peroneal brevis tendon.
3. Mild strain of the distal flexor hallucis longus muscle.

## 2020-05-19 ENCOUNTER — Ambulatory Visit: Payer: Federal, State, Local not specified - PPO | Admitting: Nurse Practitioner
